# Patient Record
Sex: Female | Born: 1945 | Race: White | Hispanic: No | Marital: Married | Smoking: Former smoker
Health system: Southern US, Community
[De-identification: ages and names within clinical notes are randomized; demographics above are authoritative.]

## PROBLEM LIST (undated history)

## (undated) DIAGNOSIS — I10 Essential (primary) hypertension: Secondary | ICD-10-CM

## (undated) HISTORY — PX: ABDOMINAL HYSTERECTOMY: SHX81

## (undated) HISTORY — PX: TONSILLECTOMY: SUR1361

## (undated) HISTORY — PX: BLADDER REPAIR: SHX76

---

## 2011-08-08 ENCOUNTER — Encounter: Payer: Self-pay | Admitting: Emergency Medicine

## 2011-08-08 ENCOUNTER — Emergency Department (INDEPENDENT_AMBULATORY_CARE_PROVIDER_SITE_OTHER)
Admission: EM | Admit: 2011-08-08 | Discharge: 2011-08-08 | Disposition: A | Discharge status: Home / Self Care | Payer: Medicare Other | Source: Home / Self Care | Attending: Emergency Medicine | Admitting: Emergency Medicine

## 2011-08-08 DIAGNOSIS — J069 Acute upper respiratory infection, unspecified: Secondary | ICD-10-CM

## 2011-08-08 DIAGNOSIS — J029 Acute pharyngitis, unspecified: Secondary | ICD-10-CM

## 2011-08-08 MED ORDER — AMOXICILLIN 875 MG PO TABS: 875.0000 mg | ORAL_TABLET | Freq: Two times a day (BID) | ORAL | Status: AC

## 2011-08-08 NOTE — ED Notes (Signed)
Sore throat, itchy ears x 1 week

## 2011-08-08 NOTE — ED Provider Notes (Signed)
History     CSN: [id number]  Arrival date & time 08/08/11  4098   First MD Initiated Contact with Patient 08/08/11 [phone]      Chief Complaint  Patient presents with  . Sore Throat    (Consider location/radiation/quality/duration/timing/severity/associated sxs/prior treatment) HPI Amber Stokes is a 66 y.o. female who complains of onset of cold symptoms for 7 days.  Using OTC cough & cold meds which help a little bit.  Throat is worse symptom and seems to be lingering. + sore throat + cough No pleuritic pain No wheezing + nasal congestion + post-nasal drainage + sinus pain/pressure No chest congestion No itchy/red eyes No earache No hemoptysis No SOB No chills/sweats + fever (1 day earlier this week) No nausea No vomiting No abdominal pain No diarrhea No skin rashes No fatigue No myalgias No headache    History reviewed. No pertinent past medical history.  Past Surgical History  Procedure Date  . Abdominal hysterectomy   . Tonsillectomy   . Bladder repair     Family History  Problem Relation Age of Onset  . Emphysema Mother   . Hypertension Mother   . Hypertension Father     History  Substance Use Topics  . Smoking status: Not on file  . Smokeless tobacco: Not on file  . Alcohol Use:     OB History    Grav Para Term Preterm Abortions TAB SAB Ect Mult Living                  Review of Systems  All other systems reviewed and are negative.    Allergies  Review of patient's allergies indicates no known allergies.  Home Medications   Current Outpatient Rx  Name Route Sig Dispense Refill  . AMOXICILLIN 875 MG PO TABS Oral Take 1 tablet (875 mg total) by mouth 2 (two) times daily. 14 tablet 0    BP 173/87  Pulse 97  Temp(Src) 98.2 F (36.8 C) (Oral)  Resp 16  Ht 5\' 6"  (1.676 m)  Wt 170 lb (77.111 kg)  BMI 27.44 kg/m2  SpO2 98%  Physical Exam  Nursing note and vitals reviewed. Constitutional: She is oriented to person, place, and time.  She appears well-developed and well-nourished.  HENT:  Head: Normocephalic and atraumatic.  Right Ear: Tympanic membrane, external ear and ear canal normal.  Left Ear: Tympanic membrane, external ear and ear canal normal.  Nose: Mucosal edema and rhinorrhea present.  Mouth/Throat: Posterior oropharyngeal erythema present. No oropharyngeal exudate or posterior oropharyngeal edema.  Eyes: No scleral icterus.  Neck: Neck supple.  Cardiovascular: Regular rhythm and normal heart sounds.   Pulmonary/Chest: Effort normal and breath sounds normal. No respiratory distress.  Neurological: She is alert and oriented to person, place, and time.  Skin: Skin is warm and dry.  Psychiatric: She has a normal mood and affect. Her speech is normal.    ED Course  Procedures (including critical care time)  Labs Reviewed - No data to display No results found.   1. Acute pharyngitis   2. Acute upper respiratory infections of unspecified site       MDM  1)  Take the prescribed antibiotic as instructed.  Can hold for a few days since rapid strep negative. 2)  Use nasal saline solution (over the counter) at least 3 times a day. 3)  Use over the counter decongestants like Zyrtec-D every 12 hours as needed to help with congestion.  If you have hypertension, do not take  medicines with sudafed.  4)  Can take tylenol every 6 hours or motrin every 8 hours for pain or fever. 5)  Follow up with your primary doctor if no improvement in 5-7 days, sooner if increasing pain, fever, or new symptoms.     Lily Kocher, MD 08/08/11 [phone]

## 2011-09-27 ENCOUNTER — Encounter (HOSPITAL_BASED_OUTPATIENT_CLINIC_OR_DEPARTMENT_OTHER): Payer: Self-pay | Admitting: *Deleted

## 2011-09-27 ENCOUNTER — Emergency Department (INDEPENDENT_AMBULATORY_CARE_PROVIDER_SITE_OTHER): Payer: Medicare Other

## 2011-09-27 ENCOUNTER — Emergency Department (HOSPITAL_BASED_OUTPATIENT_CLINIC_OR_DEPARTMENT_OTHER)
Admission: EM | Admit: 2011-09-27 | Discharge: 2011-09-27 | Disposition: A | Discharge status: Home / Self Care | Payer: Medicare Other | Attending: Emergency Medicine | Admitting: Emergency Medicine

## 2011-09-27 DIAGNOSIS — R05 Cough: Secondary | ICD-10-CM

## 2011-09-27 DIAGNOSIS — R059 Cough, unspecified: Secondary | ICD-10-CM | POA: Insufficient documentation

## 2011-09-27 DIAGNOSIS — R109 Unspecified abdominal pain: Secondary | ICD-10-CM | POA: Insufficient documentation

## 2011-09-27 DIAGNOSIS — R079 Chest pain, unspecified: Secondary | ICD-10-CM

## 2011-09-27 MED ORDER — HYDROCOD POLST-CHLORPHEN POLST 10-8 MG/5ML PO LQCR: 5.0000 mL | Freq: Two times a day (BID) | ORAL | Status: AC | PRN

## 2011-09-27 NOTE — ED Provider Notes (Signed)
History     CSN: [id number]  Arrival date & time 09/27/11  1478   First MD Initiated Contact with Patient 09/27/11 1910      Chief Complaint  Patient presents with  . Abdominal Pain    (Consider location/radiation/quality/duration/timing/severity/associated sxs/prior treatment) HPI Comments: Pt states that she has been coughing so much that she is having pain in her right ribs even when she moves now  Patient is a 66 y.o. female presenting with cough. The history is provided by the patient. No language interpreter was used.  Cough This is a new problem. The current episode started more than 2 days ago. The problem occurs hourly. The problem has not changed since onset.The cough is productive of sputum. There has been no fever. Associated symptoms include rhinorrhea. Pertinent negatives include no shortness of breath. Treatments tried: asa and claritin. She is not a smoker.    History reviewed. No pertinent past medical history.  Past Surgical History  Procedure Date  . Abdominal hysterectomy   . Tonsillectomy   . Bladder repair     Family History  Problem Relation Age of Onset  . Emphysema Mother   . Hypertension Mother   . Hypertension Father     History  Substance Use Topics  . Smoking status: Not on file  . Smokeless tobacco: Not on file  . Alcohol Use:     OB History    Grav Para Term Preterm Abortions TAB SAB Ect Mult Living                  Review of Systems  Constitutional: Negative.   HENT: Positive for rhinorrhea.   Respiratory: Positive for cough. Negative for shortness of breath.   Cardiovascular: Negative.   Genitourinary: Negative.   Neurological: Negative.     Allergies  Review of patient's allergies indicates no known allergies.  Home Medications  No current outpatient prescriptions on file.  BP 190/77  Pulse 98  Temp(Src) 98.3 F (36.8 C) (Oral)  Resp 20  Ht 5\' 6"  (1.676 m)  Wt 165 lb (74.844 kg)  BMI 26.63 kg/m2  SpO2  100%  Physical Exam  Nursing note and vitals reviewed. Constitutional: She is oriented to person, place, and time. She appears well-developed and well-nourished.  HENT:  Head: Normocephalic and atraumatic.  Right Ear: External ear normal.  Left Ear: External ear normal.  Nose: Rhinorrhea present.  Mouth/Throat: Oropharynx is clear and moist.  Eyes: Conjunctivae and EOM are normal. Pupils are equal, round, and reactive to light.  Neck: Neck supple.  Cardiovascular: Normal rate and regular rhythm.   Pulmonary/Chest: Effort normal. She has rales.       Pt tender on right ribs  Abdominal: Soft. Bowel sounds are normal. There is no tenderness.  Musculoskeletal: Normal range of motion.  Neurological: She is alert and oriented to person, place, and time.  Skin: Skin is warm and dry.  Psychiatric: She has a normal mood and affect.    ED Course  Procedures (including critical care time)  Labs Reviewed - No data to display Dg Chest 2 View  09/27/2011  *RADIOLOGY REPORT*  Clinical Data: Cough, right rib pain  CHEST - 2 VIEW  Comparison: None.  Findings: Lungs are essentially clear. No pleural effusion or pneumothorax.  Cardiomediastinal silhouette is within normal limits.  Mild degenerative changes of the visualized thoracolumbar spine.  IMPRESSION: No evidence of acute cardiopulmonary disease.  Original Report Authenticated By: Charline Bills, M.D.     1.  Cough       MDM  No infection noted:will treat symptomatically       Teressa Lower, NP 09/27/11 2009

## 2011-09-27 NOTE — ED Provider Notes (Signed)
Medical screening examination/treatment/procedure(s) were performed by non-physician practitioner and as supervising physician I was immediately available for consultation/collaboration.    Suha Schoenbeck L Paw Karstens, MD 09/27/11 2252 

## 2011-09-27 NOTE — ED Notes (Signed)
Pt states she has had cough and allergy s/s for awhile and for the past few days has had right side pain. Denies N/V/D or problems urinating. "Coughing my head off" Pain is worse with cough.

## 2011-09-27 NOTE — Discharge Instructions (Signed)
Cough, Adult  A cough is a reflex. It helps you clear your throat and airways. A cough can help heal your body. A cough can last 2 or 3 weeks (acute) or may last more than 8 weeks (chronic). Some common causes of a cough can include an infection, allergy, or a cold. HOME CARE  Only take medicine as told by your doctor.   If given, take your medicines (antibiotics) as told. Finish them even if you start to feel better.   Use a cold steam vaporizer or humidier in your home. This can help loosen thick spit (secretions).   Sleep so you are almost sitting up (semi-upright). Use pillows to do this. This helps reduce coughing.   Rest as needed.   Stop smoking if you smoke.  GET HELP RIGHT AWAY IF:  You have yellowish-white fluid (pus) in your thick spit.   Your cough gets worse.   Your medicine does not reduce coughing, and you are losing sleep.   You cough up blood.   You have trouble breathing.   Your pain gets worse and medicine does not help.   You have a fever.  MAKE SURE YOU:   Understand these instructions.   Will watch your condition.   Will get help right away if you are not doing well or get worse.  Document Released: 02/13/2011 Document Revised: 05/22/2011 Document Reviewed: 02/13/2011 [employer] Patient Information 2012 [employer]. 

## 2015-07-28 ENCOUNTER — Emergency Department (INDEPENDENT_AMBULATORY_CARE_PROVIDER_SITE_OTHER): Payer: Medicare Other

## 2015-07-28 ENCOUNTER — Encounter: Payer: Self-pay | Admitting: Emergency Medicine

## 2015-07-28 ENCOUNTER — Emergency Department
Admission: EM | Admit: 2015-07-28 | Discharge: 2015-07-28 | Disposition: A | Discharge status: Home / Self Care | Payer: Medicare Other | Source: Home / Self Care | Attending: Family Medicine | Admitting: Family Medicine

## 2015-07-28 DIAGNOSIS — M542 Cervicalgia: Secondary | ICD-10-CM

## 2015-07-28 DIAGNOSIS — R55 Syncope and collapse: Secondary | ICD-10-CM | POA: Diagnosis not present

## 2015-07-28 DIAGNOSIS — R52 Pain, unspecified: Secondary | ICD-10-CM | POA: Diagnosis not present

## 2015-07-28 DIAGNOSIS — M25841 Other specified joint disorders, right hand: Secondary | ICD-10-CM | POA: Diagnosis not present

## 2015-07-28 DIAGNOSIS — R05 Cough: Secondary | ICD-10-CM

## 2015-07-28 DIAGNOSIS — S61216A Laceration without foreign body of right little finger without damage to nail, initial encounter: Secondary | ICD-10-CM | POA: Diagnosis not present

## 2015-07-28 DIAGNOSIS — Z23 Encounter for immunization: Secondary | ICD-10-CM

## 2015-07-28 DIAGNOSIS — W19XXXA Unspecified fall, initial encounter: Secondary | ICD-10-CM

## 2015-07-28 DIAGNOSIS — R0981 Nasal congestion: Secondary | ICD-10-CM

## 2015-07-28 DIAGNOSIS — IMO0002 Reserved for concepts with insufficient information to code with codable children: Secondary | ICD-10-CM

## 2015-07-28 HISTORY — DX: Essential (primary) hypertension: I10

## 2015-07-28 MED ORDER — TETANUS-DIPHTH-ACELL PERTUSSIS 5-2.5-18.5 LF-MCG/0.5 IM SUSP
0.5000 mL | Freq: Once | INTRAMUSCULAR | Status: AC
  Administered 2015-07-28: 0.5 mL via INTRAMUSCULAR

## 2015-07-28 NOTE — ED Notes (Signed)
Pt states she fainted this am and cut her right pinky. She is unsure what she cut it on. States she has hx of fainting. Unsure last tetanus.

## 2015-07-28 NOTE — Discharge Instructions (Signed)
Please be sure to stay well hydrated and to take cough and cold medications as directed on the packaging.  Make sure that you compare the ingredients in all the medications you are taking to make sure you are not doubling the dose as this could have caused your passing out.  Be sure to get at least 8 hours of sleep while you are sick, preferably more.  Come back in 2 days to have wound rechecked.  Please call 911 or go to closest emergency department if you develop a severe headache, weakness worsens especially if you pass out again, chest pain, difficulty breathing or other new concerning symptoms develop.

## 2015-07-28 NOTE — ED Notes (Signed)
Orthostatics. Sitting 160/80. Standing 154/82.  Lying 162/84. Applied telfa and coban to right pinky.

## 2015-07-28 NOTE — ED Provider Notes (Signed)
CSN: [id number]     Arrival date & time 07/28/15  0909 History   First MD Initiated Contact with Patient 07/28/15 0930     Chief Complaint  Patient presents with  . Extremity Laceration   (Consider location/radiation/quality/duration/timing/severity/associated sxs/prior Treatment) HPI  Pt is a 70yo female presenting to [facility] with reports of syncopal episode with a fall that occurred around 5AM this morning, resulting in a laceration to her Right little finger. Pt notes she has had cough, congestion and chills all week and has been taking OTC medications and believes she may have taken too many cough/cold medications as she has not had much to eat.  She had chills this morning around 2 AM and 5AM. She does not believe she hit her head but is c/o neck pain and notes she must have grabbed a cabinet while falling as she has a laceration to her Right little finger. Bleeding controlled with light pressure PTA. Pt cleaned the wound with cold water and applied antibiotic ointment.  She believes her tetanus is UTD. Finger pain is 4/10 at this time. No pain medication taken PTA. She is also c/o body aches and mild neck pain that is 3/10.  She is not on blood thinners. Denies chest pain, palpitations or SOB. She reports hx of similar syncopal episode about 5 years ago and she was sick at that time as well. Pt does not want to go to emergency department for further workup of her syncope.   Past Medical History  Diagnosis Date  . Hypertension    Past Surgical History  Procedure Laterality Date  . Abdominal hysterectomy    . Tonsillectomy    . Bladder repair     Family History  Problem Relation Age of Onset  . Emphysema Mother   . Hypertension Mother   . Hypertension Father    Social History  Substance Use Topics  . Smoking status: Former Smoker    Types: Cigarettes    Quit date: 07/27/1978  . Smokeless tobacco: None  . Alcohol Use: No   OB History    No data available     Review of Systems   Constitutional: Positive for chills and fatigue. Negative for fever.  HENT: Positive for congestion, postnasal drip, rhinorrhea and sinus pressure. Negative for ear pain and sore throat.   Respiratory: Positive for cough. Negative for shortness of breath.   Cardiovascular: Negative for chest pain, palpitations and leg swelling.  Gastrointestinal: Negative for nausea, vomiting, abdominal pain and diarrhea.  Musculoskeletal: Positive for myalgias, arthralgias and neck pain. Negative for neck stiffness.  Skin: Positive for wound. Negative for color change.  Neurological: Positive for syncope, weakness (generalized) and light-headedness. Negative for dizziness and headaches.    Allergies  Review of patient's allergies indicates no known allergies.  Home Medications   Prior to Admission medications   Medication Sig Start Date End Date Taking? Authorizing Provider  Calcium Carbonate-Vitamin D (CALCIUM + D PO) Take 1 capsule by mouth 2 (two) times daily.    Historical Provider, MD  chlorpheniramine-HYDROcodone (TUSSIONEX PENNKINETIC ER) 10-8 MG/5ML LQCR Take 5 mLs by mouth every 12 (twelve) hours as needed. 09/27/11   Teressa Lower, NP  fish oil-omega-3 fatty acids 1000 MG capsule Take 2 g by mouth daily.    Historical Provider, MD  Flaxseed, Linseed, (FLAX SEED OIL) 1000 MG CAPS Take 1 capsule by mouth daily.    Historical Provider, MD  verapamil (CALAN) 80 MG tablet Take 80 mg by mouth 3 (three)  times daily.    Historical Provider, MD   Meds Ordered and Administered this Visit   Medications  Tdap (BOOSTRIX) injection 0.5 mL (0.5 mLs Intramuscular Given 07/28/15 1054)    BP 184/77 mmHg  Pulse 102  Temp(Src) 97.6 F (36.4 C) (Oral)  Wt 165 lb (74.844 kg)  SpO2 96% No data found.   Physical Exam  Constitutional: She is oriented to person, place, and time. She appears well-developed and well-nourished. No distress.  HENT:  Head: Normocephalic and atraumatic.  Eyes: Conjunctivae  are normal. No scleral icterus.  Neck: Normal range of motion. Neck supple.  No point tenderness. Full ROM. Diffuse tenderness, mainly to cervical paraspinal muscles.   Cardiovascular: Normal rate, regular rhythm and normal heart sounds.   Pulmonary/Chest: Effort normal and breath sounds normal. No respiratory distress. She has no wheezes. She has no rales. She exhibits no tenderness.  Abdominal: Soft. She exhibits no distension. There is no tenderness.  Musculoskeletal: Normal range of motion. She exhibits edema and tenderness.  Right small finger: mild edema to distal aspect with laceration (see skin exam). Full ROM  No midline spinal tenderness. Full ROM upper and lower extremities with 5/5 strength. Normal gait.   Neurological: She is alert and oriented to person, place, and time. She has normal strength. No cranial nerve deficit or sensory deficit. Coordination and gait normal. GCS eye subscore is 4. GCS verbal subscore is 5. GCS motor subscore is 6.  Skin: Skin is warm and dry. She is not diaphoretic.  Right little finger, volar aspect: 3cm jagged laceration with skin flap and mild edema. Bleeding controlled. No foreign bodies seen or palpated. No nailbed involvement.   Nursing note and vitals reviewed.   ED Course  .Marland KitchenLaceration Repair Date/Time: 07/28/2015 10:03 AM Performed by: Junius Finner Authorized by: Donna Christen A Consent: Verbal consent obtained. Risks and benefits: risks, benefits and alternatives were discussed Consent given by: patient Patient understanding: patient states understanding of the procedure being performed Patient consent: the patient's understanding of the procedure matches consent given Site marked: the operative site was marked Required items: required blood products, implants, devices, and special equipment available Patient identity confirmed: verbally with patient Body area: upper extremity Location details: right small finger Laceration length:  3 cm Foreign bodies: no foreign bodies Tendon involvement: none Nerve involvement: superficial Vascular damage: no Anesthesia: digital block Local anesthetic: lidocaine 2% without epinephrine Anesthetic total: 2 ml Patient sedated: no Preparation: Patient was prepped and draped in the usual sterile fashion. Irrigation solution: saline Irrigation method: syringe Amount of cleaning: standard Debridement: none Degree of undermining: none Skin closure: 4-0 Prolene Number of sutures: 6 Technique: simple Approximation: close Approximation difficulty: complex Dressing: 4x4 sterile gauze and antibiotic ointment Patient tolerance: Patient tolerated the procedure well with no immediate complications     Labs Review Labs Reviewed - No data to display  Imaging Review Dg Chest 2 View  07/28/2015  CLINICAL DATA:  Cough and congestion.  Fall earlier today EXAM: CHEST  2 VIEW COMPARISON:  September 27, 2011 FINDINGS: There is no edema or consolidation. Heart size and pulmonary vascularity are normal. No adenopathy. No pneumothorax. No fracture evident. IMPRESSION: No edema or consolidation. Electronically Signed   By: Bretta Bang III M.D.   On: 07/28/2015 10:27   Dg Cervical Spine Complete  07/28/2015  CLINICAL DATA:  70 year old female with neck/cervical spine pain following fall today. Initial encounter. EXAM: CERVICAL SPINE - COMPLETE 4+ VIEW COMPARISON:  None. FINDINGS: Normal alignment is  noted. There is no evidence of acute fracture, subluxation or prevertebral soft tissue swelling. 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 Mild multilevel facet arthropathy is noted. No focal bony lesions are present. IMPRESSION: No evidence of acute abnormality. Mild moderate degenerative changes at C5-6 and C6-7. Electronically Signed   By: Harmon Pier M.D.   On: 07/28/2015 10:33   Dg Hand Complete  Right  07/28/2015  CLINICAL DATA:  Pain following fall EXAM: RIGHT HAND - COMPLETE 3+ VIEW COMPARISON:  None. FINDINGS: Frontal, oblique, and lateral views were obtained. There is soft tissue swelling of the second DIP joint. A small calcification dorsal to the proximal aspect of the second distal phalanx may represent a tiny avulsion injury. No other evidence suggesting potential fracture. No dislocation. No radiopaque foreign body. There is slight narrowing of the first IP joint as well as the second and fifth DIP joints. Other joint spaces appear normal. IMPRESSION: Small calcification dorsal to the proximal aspect of the second distal phalanx. Suspect small avulsion injury in this area. No other evidence suggesting fracture. No dislocation. 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 No radiopaque foreign body. Electronically Signed   By: Bretta Bang III M.D.   On: 07/28/2015 10:33      MDM   1. Syncope and collapse   2. Neck pain   3. Laceration   4. Laceration of right little finger w/o foreign body w/o damage to nail, initial encounter   5. Body aches   6. Sinus congestion    Pt presenting after a syncope episode with collapse. Pt does not believe she hit her head. She is not on blood thinners. She does have mild diffuse neck pain as well as body aches and notes she has been sick with congestion for 1 week.    No evidence of head trauma. Pt does not want to go to emergency department for workup of syncopal episode and does not want a CT of head head at this time.   Neuro exam is unremarkable. Agree pt can be evaluated and treated in UC setting at this time.  Plain films: negative for fracture in Right little finger. Questionable avulsion fracture index finger, pt is non-tender at this site.  CXR: no signs of pneumonia. Orthostatic vitals: WNL  Laceration to Right little finger, repaired with 6 sutures. Pt encouraged to f/u in 2 days for wound recheck. Home care  instructions for sutures provided. Pt safe for discharge home.  Discussed symptoms that warrant emergent care in the ED. Patient and husband verbalized understanding and agreement with treatment plan.    Junius Finner, PA-C 07/28/15 1745

## 2015-07-29 ENCOUNTER — Telehealth: Payer: Self-pay | Admitting: Emergency Medicine

## 2015-07-30 ENCOUNTER — Emergency Department (INDEPENDENT_AMBULATORY_CARE_PROVIDER_SITE_OTHER)
Admission: EM | Admit: 2015-07-30 | Discharge: 2015-07-30 | Disposition: A | Discharge status: Home / Self Care | Payer: Medicare Other | Source: Home / Self Care

## 2015-07-30 ENCOUNTER — Encounter: Payer: Self-pay | Admitting: *Deleted

## 2015-07-30 DIAGNOSIS — Z5189 Encounter for other specified aftercare: Secondary | ICD-10-CM

## 2015-07-30 NOTE — ED Provider Notes (Signed)
CSN: [id number]     Arrival date & time 07/30/15  1429 History   None    Chief Complaint  Patient presents with  . Wound Check      HPI Comments: Patient returns for follow-up of finger laceration.  She has no complaints.  The history is provided by the patient.    Past Medical History  Diagnosis Date  . Hypertension    Past Surgical History  Procedure Laterality Date  . Abdominal hysterectomy    . Tonsillectomy    . Bladder repair     Family History  Problem Relation Age of Onset  . Emphysema Mother   . Hypertension Mother   . Hypertension Father    Social History  Substance Use Topics  . Smoking status: Former Smoker    Types: Cigarettes    Quit date: 07/27/1978  . Smokeless tobacco: None  . Alcohol Use: No   OB History    No data available     Review of Systems Patient reports no further dizziness or light-headedness.  No drainage from finger laceration. Allergies  Review of patient's allergies indicates no known allergies.  Home Medications   Prior to Admission medications   Medication Sig Start Date End Date Taking? Authorizing Provider  Calcium Carbonate-Vitamin D (CALCIUM + D PO) Take 1 capsule by mouth 2 (two) times daily.    Historical Provider, MD  chlorpheniramine-HYDROcodone (TUSSIONEX PENNKINETIC ER) 10-8 MG/5ML LQCR Take 5 mLs by mouth every 12 (twelve) hours as needed. 09/27/11   Teressa Lower, NP  fish oil-omega-3 fatty acids 1000 MG capsule Take 2 g by mouth daily.    Historical Provider, MD  Flaxseed, Linseed, (FLAX SEED OIL) 1000 MG CAPS Take 1 capsule by mouth daily.    Historical Provider, MD  verapamil (CALAN) 80 MG tablet Take 80 mg by mouth 3 (three) times daily.    Historical Provider, MD   Meds Ordered and Administered this Visit  Medications - No data to display  BP 151/84 mmHg  Pulse 83  Temp(Src) 97.3 F (36.3 C) (Oral)  Resp 16  SpO2 97% No data found.   Physical Exam Nursing notes and Vital Signs  reviewed. Appearance:  Patient appears stated age, and in no acute distress Right fifth finger:  Laceration has no swelling, drainage, erythema, or tenderness to palpation   ED Course  Procedures none    MDM   1. Visit for wound check;  No evidence cellulitis     Change dressing daily and apply triple antibiotic ointment to wound.  Keep wound clean and dry.  Return for any signs of infection (or follow-up with family doctor):  Increasing redness, swelling, pain, heat, drainage, etc. May followup with family doctor in one week for suture removal.    Lattie Haw, MD 08/06/15 [phone]

## 2015-07-30 NOTE — ED Notes (Signed)
Amber Stokes is here today for a recheck of RT 5th finger laceration x 2 days ago.

## 2015-07-30 NOTE — Discharge Instructions (Signed)
Change dressing daily and apply triple antibiotic ointment to wound.  Keep wound clean and dry.  Return for any signs of infection (or follow-up with family doctor):  Increasing redness, swelling, pain, heat, drainage, etc. May followup with family doctor in one week for suture removal.

## 2017-03-28 IMAGING — CR DG HAND COMPLETE 3+V*R*
3 series · 3 of 3 positions shown · non-contrast
Comparison: None.

CLINICAL DATA: Pain following fall

EXAM:
RIGHT HAND - COMPLETE 3+ VIEW

[hand pa]
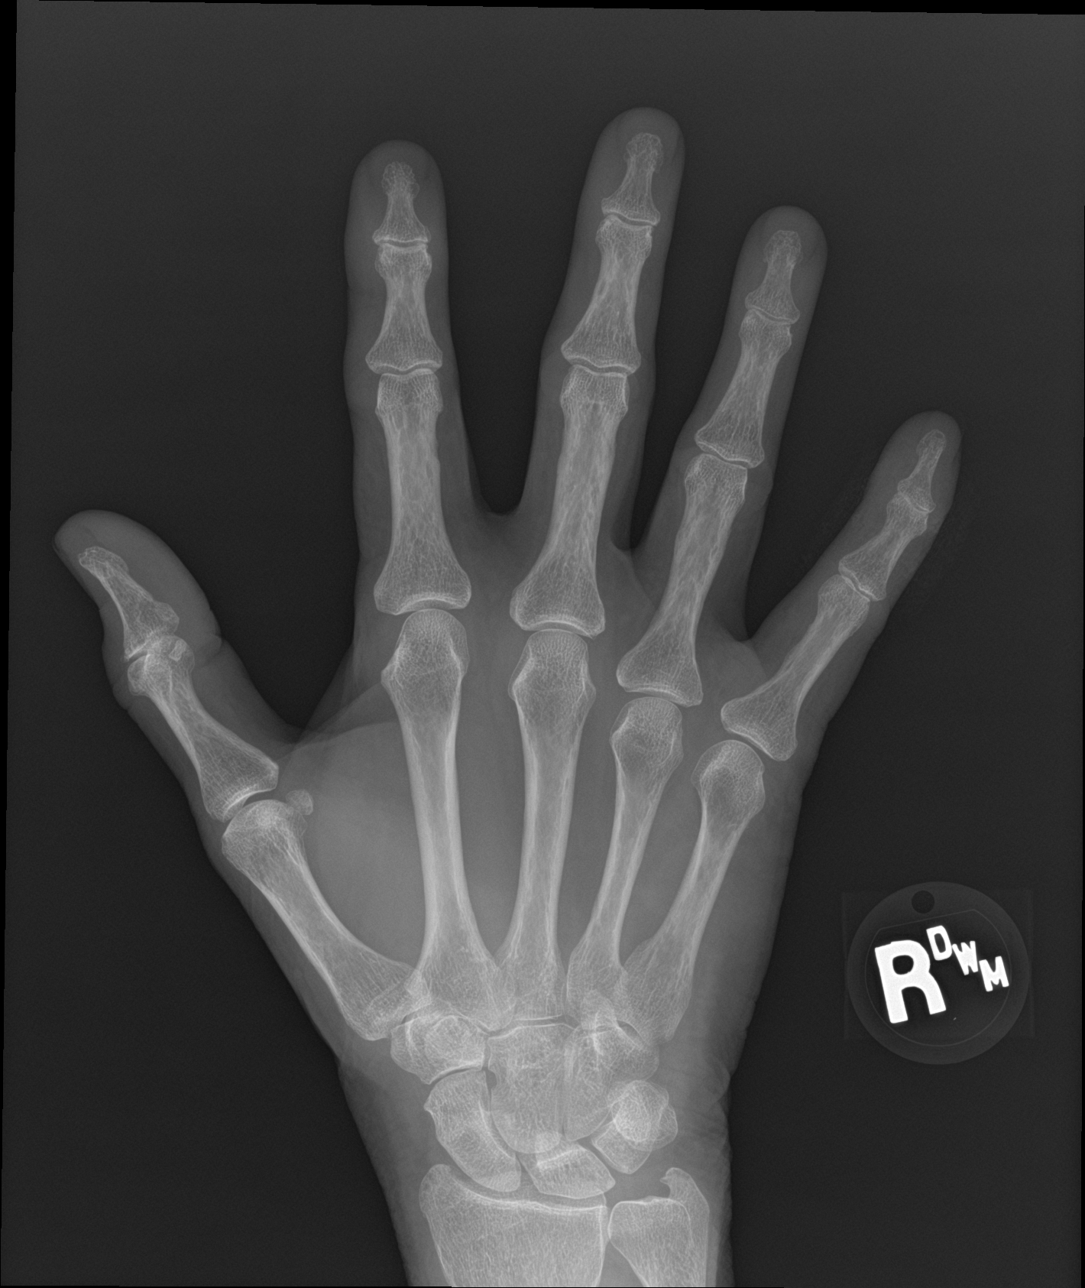

[hand obl]
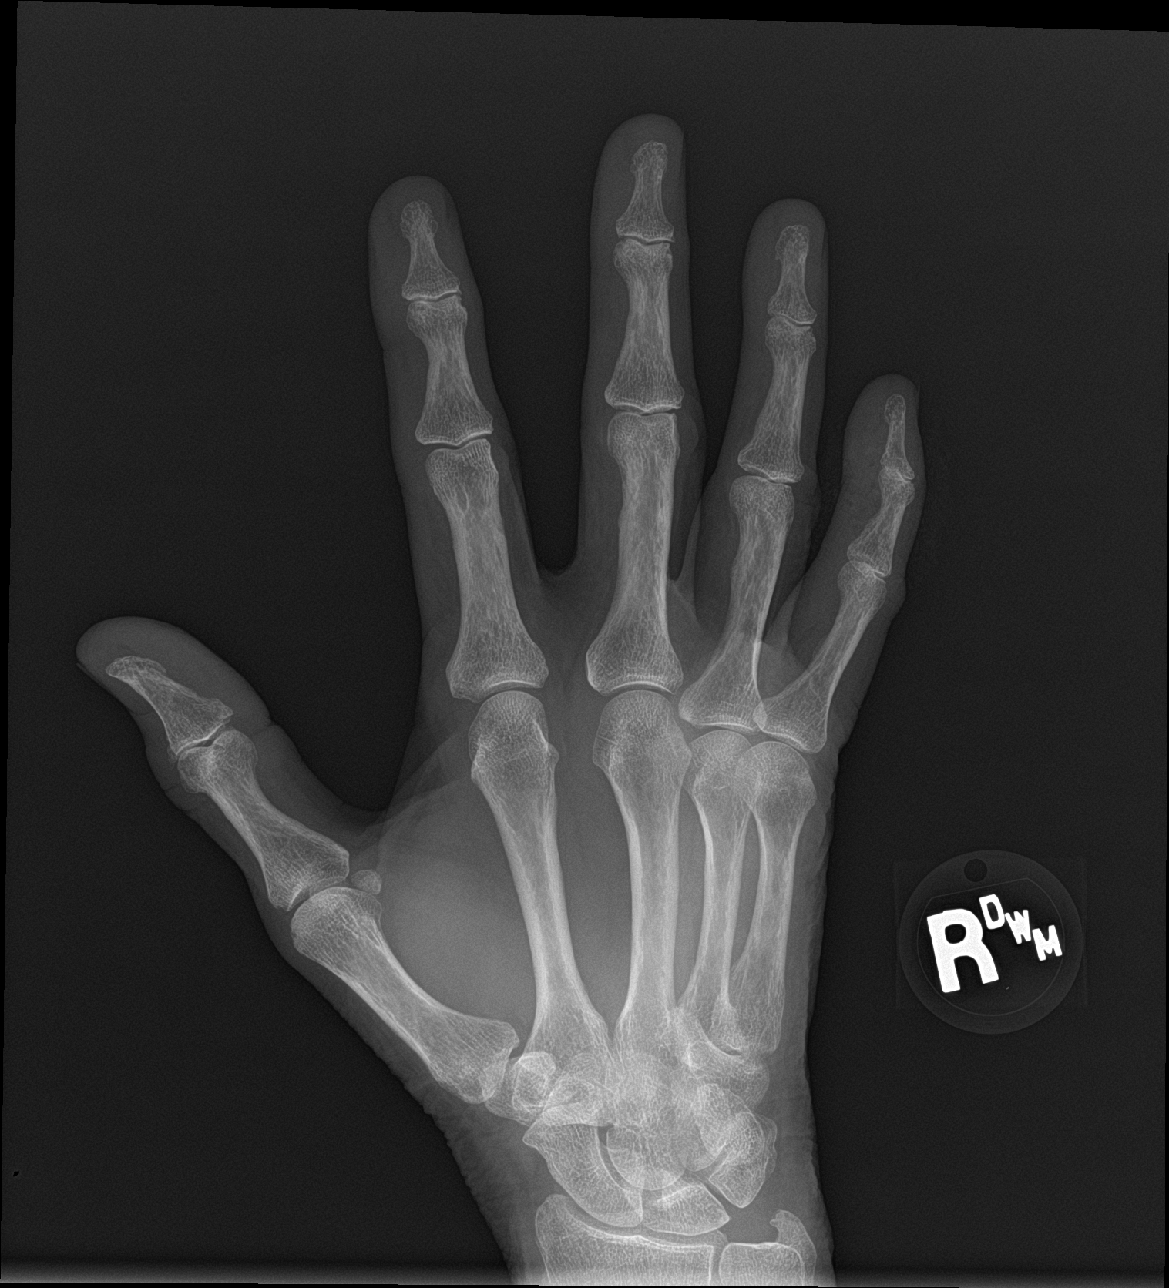

[hand lat]
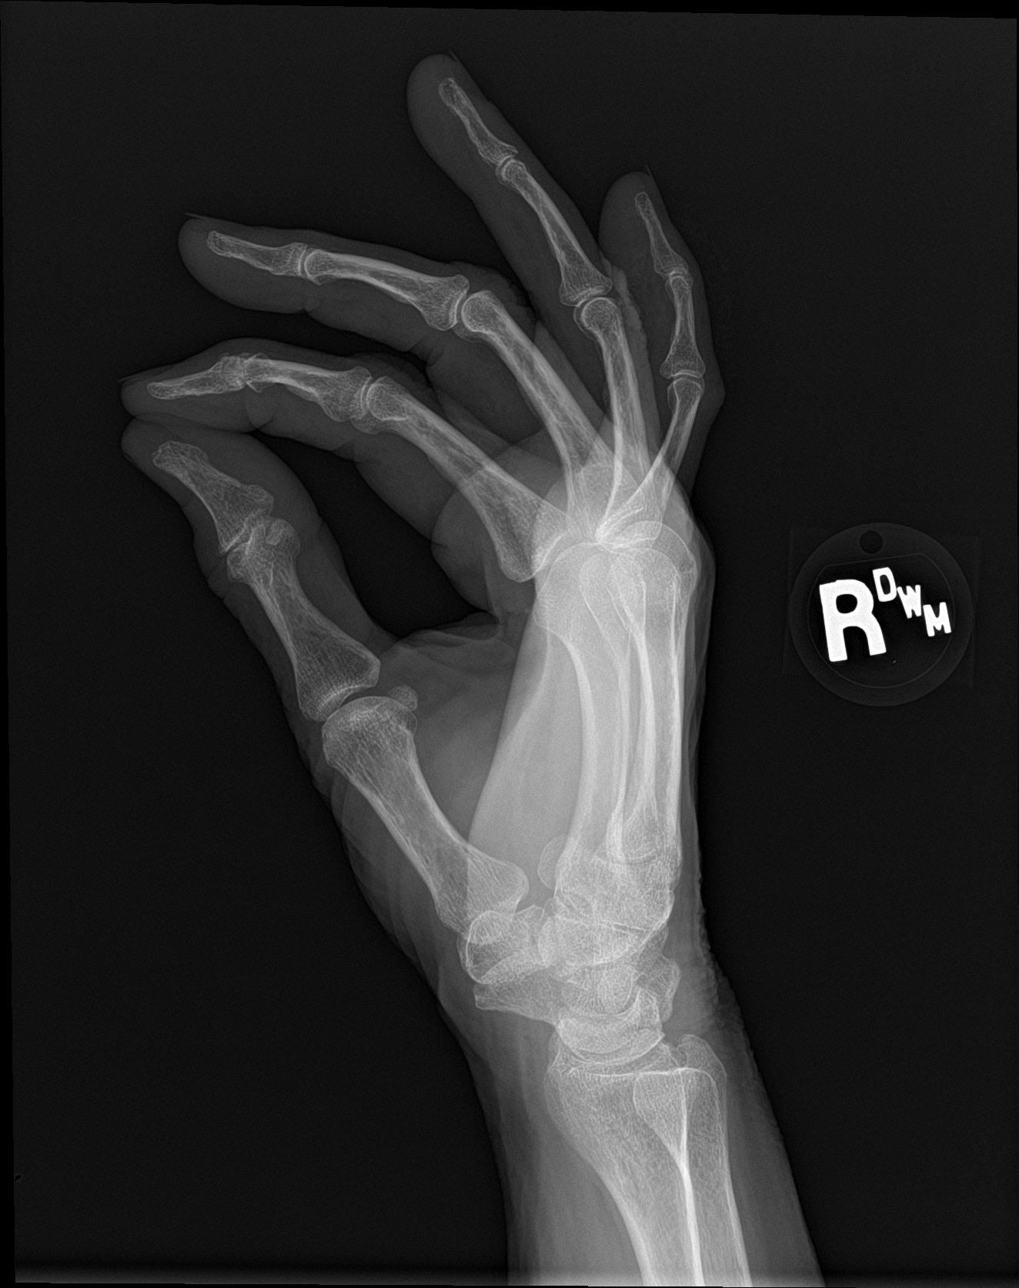

[3 of 3 positions shown; findings below may reference images not displayed]

FINDINGS: Frontal, oblique, and lateral views were obtained. There is soft
tissue swelling of the second DIP joint. A small calcification
dorsal to the proximal aspect of the second distal phalanx may
represent a tiny avulsion injury. No other evidence suggesting
potential fracture. No dislocation. No radiopaque foreign body.
There is slight narrowing of the first IP joint as well as the
second and fifth DIP joints. Other joint spaces appear normal.
IMPRESSION: Small calcification dorsal to the proximal aspect of the second
distal phalanx. Suspect small avulsion injury in this area. No other
evidence suggesting fracture. No dislocation. Mild osteoarthritic
change in several distal joints. No radiopaque foreign body.

## 2017-03-28 IMAGING — CR DG CERVICAL SPINE COMPLETE 4+V
5 series · 5 of 5 positions shown · non-contrast
Comparison: None.

CLINICAL DATA: 69-year-old female with neck/cervical spine pain
following fall today. Initial encounter.

EXAM:
CERVICAL SPINE - COMPLETE 4+ VIEW

[c-spine lat]
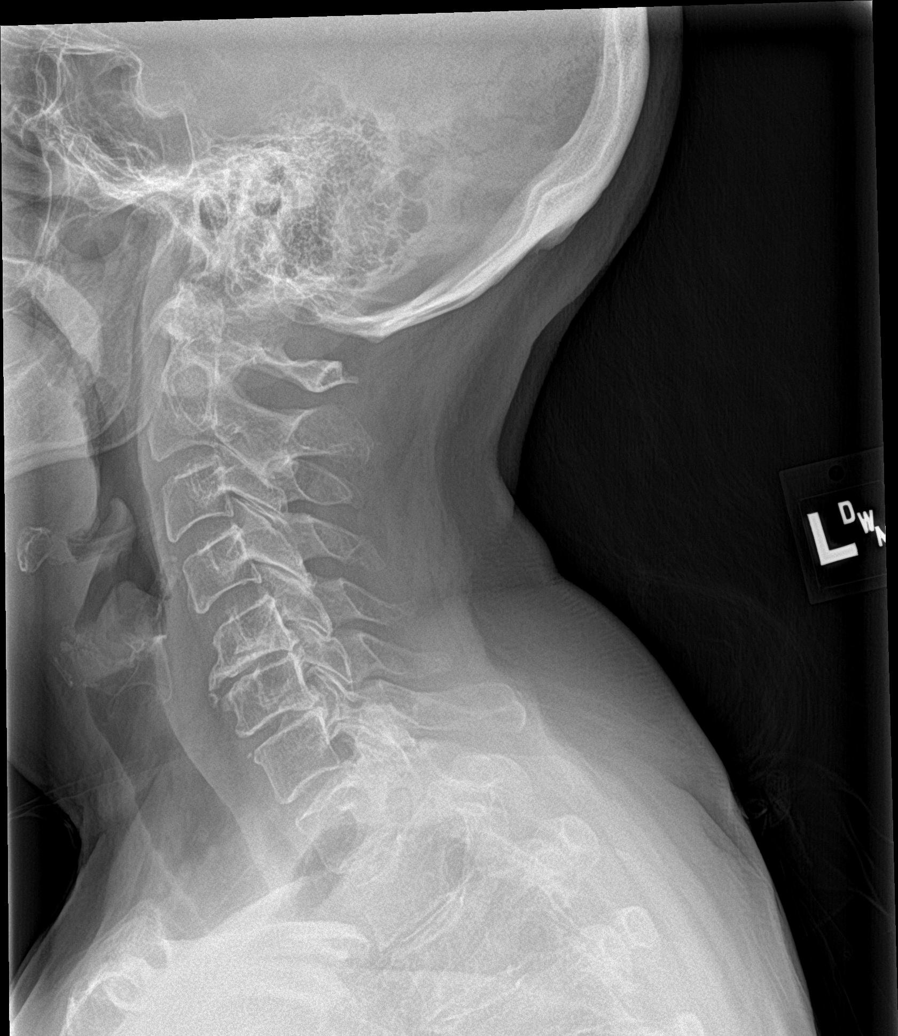

[c-spine obl (1 of 2)]
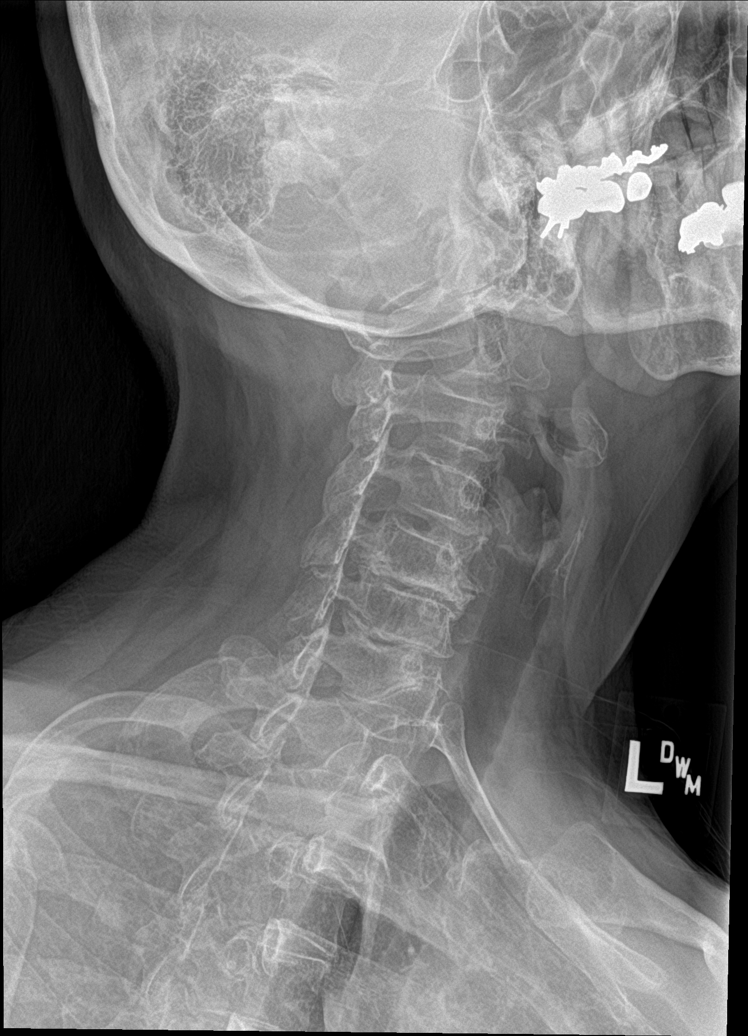

[c-spine obl (2 of 2)]
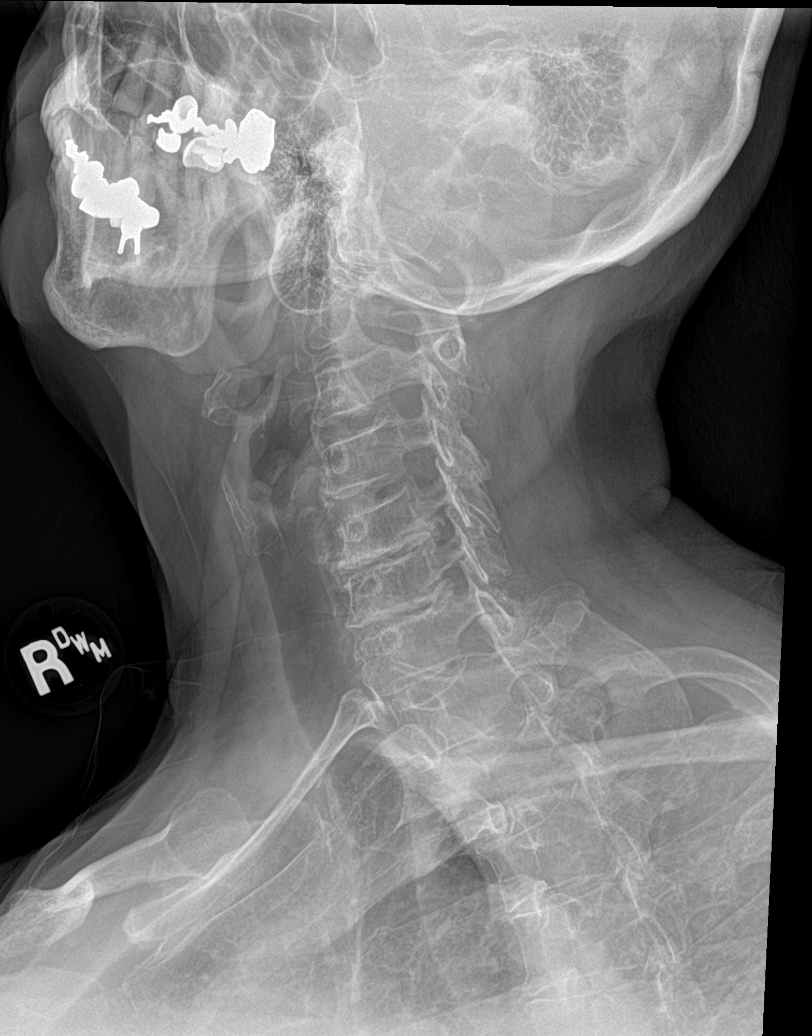

[c-spine ap]
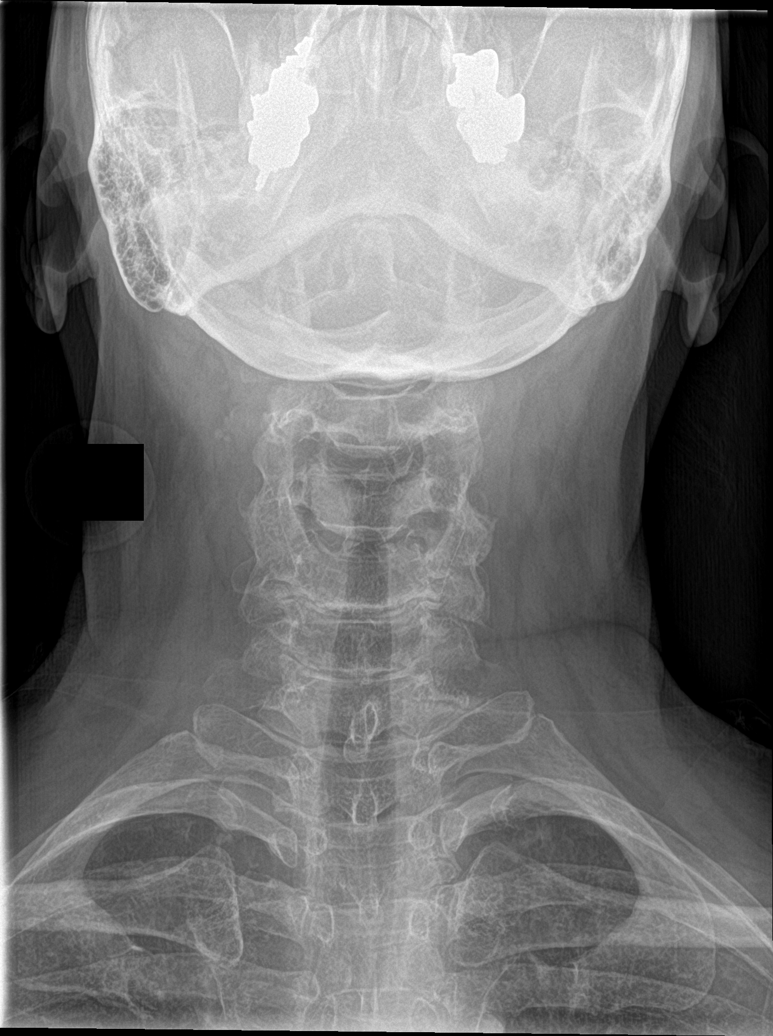

[c-spine open mouth]
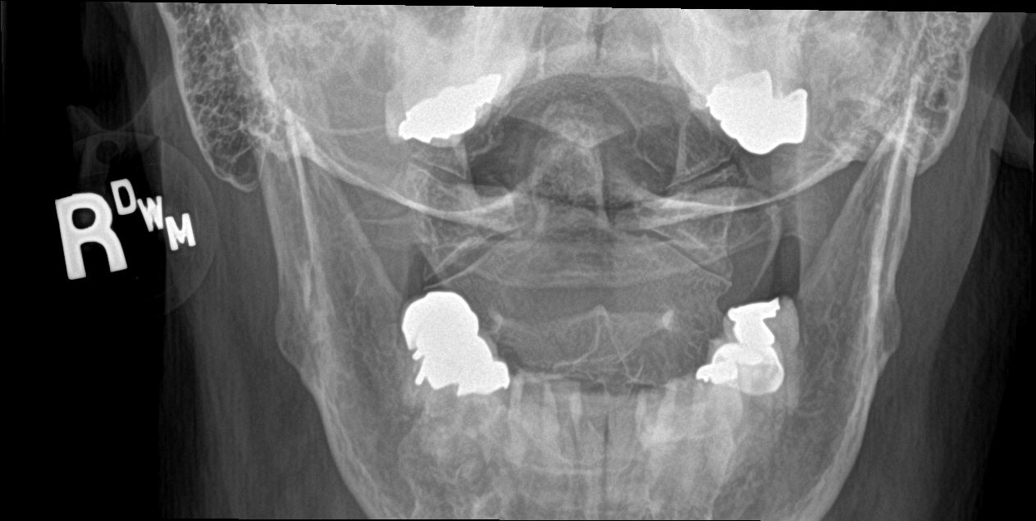

[5 of 5 positions shown; findings below may reference images not displayed]

FINDINGS: Normal alignment is noted.

There is no evidence of acute fracture, subluxation or prevertebral
soft tissue swelling.

Mild-moderate degenerative disc disease and spondylosis at C5-6 and
C6-7 identified with mild bony foraminal narrowing at these levels.

Mild multilevel facet arthropathy is noted.

No focal bony lesions are present.
IMPRESSION: No evidence of acute abnormality.

Mild moderate degenerative changes at C5-6 and C6-7.
